# Patient Record
Sex: Male | Born: 1995 | Race: Black or African American | Hispanic: No | Marital: Single | Smoking: Never smoker
Health system: Southern US, Community
[De-identification: ages and names within clinical notes are randomized; demographics above are authoritative.]

## PROBLEM LIST (undated history)

## (undated) ENCOUNTER — Emergency Department (HOSPITAL_COMMUNITY): Admission: EM | Discharge status: Absent without leave | Payer: Self-pay

## (undated) DIAGNOSIS — IMO0002 Reserved for concepts with insufficient information to code with codable children: Secondary | ICD-10-CM

## (undated) DIAGNOSIS — Z91199 Patient's noncompliance with other medical treatment and regimen due to unspecified reason: Secondary | ICD-10-CM

## (undated) DIAGNOSIS — Z9119 Patient's noncompliance with other medical treatment and regimen: Secondary | ICD-10-CM

## (undated) DIAGNOSIS — J020 Streptococcal pharyngitis: Secondary | ICD-10-CM

## (undated) HISTORY — PX: OTHER SURGICAL HISTORY: SHX169

## (undated) HISTORY — DX: Reserved for concepts with insufficient information to code with codable children: IMO0002

---
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
## 2015-04-15 DIAGNOSIS — J069 Acute upper respiratory infection, unspecified: Secondary | ICD-10-CM | POA: Diagnosis not present

## 2015-04-15 DIAGNOSIS — J09X2 Influenza due to identified novel influenza A virus with other respiratory manifestations: Secondary | ICD-10-CM | POA: Diagnosis not present

## 2015-04-15 DIAGNOSIS — J300 Vasomotor rhinitis: Secondary | ICD-10-CM | POA: Diagnosis not present

## 2015-05-04 ENCOUNTER — Emergency Department (HOSPITAL_COMMUNITY)
Admission: EM | Admit: 2015-05-04 | Discharge: 2015-05-04 | Disposition: A | Discharge status: Home / Self Care | Payer: Medicaid Other | Attending: Emergency Medicine | Admitting: Emergency Medicine

## 2015-05-04 DIAGNOSIS — K029 Dental caries, unspecified: Secondary | ICD-10-CM | POA: Insufficient documentation

## 2015-05-04 DIAGNOSIS — Z9119 Patient's noncompliance with other medical treatment and regimen: Secondary | ICD-10-CM | POA: Insufficient documentation

## 2015-05-04 DIAGNOSIS — Z8781 Personal history of (healed) traumatic fracture: Secondary | ICD-10-CM | POA: Insufficient documentation

## 2015-05-04 DIAGNOSIS — K0889 Other specified disorders of teeth and supporting structures: Secondary | ICD-10-CM | POA: Insufficient documentation

## 2015-05-04 DIAGNOSIS — Z791 Long term (current) use of non-steroidal anti-inflammatories (NSAID): Secondary | ICD-10-CM | POA: Insufficient documentation

## 2015-05-04 DIAGNOSIS — Z8709 Personal history of other diseases of the respiratory system: Secondary | ICD-10-CM | POA: Insufficient documentation

## 2015-05-04 MED ORDER — IBUPROFEN 800 MG PO TABS: 800.0000 mg | ORAL_TABLET | Freq: Three times a day (TID) | ORAL | Status: AC

## 2015-05-04 MED ORDER — PENICILLIN V POTASSIUM 500 MG PO TABS: 500.0000 mg | ORAL_TABLET | Freq: Three times a day (TID) | ORAL | Status: AC

## 2015-05-04 NOTE — Discharge Instructions (Signed)

## 2015-05-04 NOTE — ED Notes (Addendum)
Pt reports R lower dental pain radiating to top of head. No pain at present.
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
## 2015-06-23 ENCOUNTER — Emergency Department (HOSPITAL_COMMUNITY)
Admission: EM | Admit: 2015-06-23 | Discharge: 2015-06-23 | Disposition: A | Discharge status: Home / Self Care | Payer: 59 | Attending: Emergency Medicine | Admitting: Emergency Medicine

## 2015-06-23 ENCOUNTER — Encounter (HOSPITAL_COMMUNITY): Payer: Self-pay | Admitting: Emergency Medicine

## 2015-06-23 DIAGNOSIS — Z79899 Other long term (current) drug therapy: Secondary | ICD-10-CM | POA: Diagnosis not present

## 2015-06-23 DIAGNOSIS — R52 Pain, unspecified: Secondary | ICD-10-CM

## 2015-06-23 DIAGNOSIS — J029 Acute pharyngitis, unspecified: Secondary | ICD-10-CM | POA: Diagnosis not present

## 2015-06-23 DIAGNOSIS — Z791 Long term (current) use of non-steroidal anti-inflammatories (NSAID): Secondary | ICD-10-CM | POA: Insufficient documentation

## 2015-06-23 DIAGNOSIS — R509 Fever, unspecified: Secondary | ICD-10-CM | POA: Diagnosis present

## 2015-06-23 DIAGNOSIS — M791 Myalgia: Secondary | ICD-10-CM | POA: Diagnosis not present

## 2015-06-23 LAB — RAPID STREP SCREEN (MED CTR MEBANE ONLY): Streptococcus, Group A Screen (Direct): NEGATIVE

## 2015-06-23 MED ORDER — IBUPROFEN 800 MG PO TABS
800.0000 mg | ORAL_TABLET | Freq: Once | ORAL | Status: AC
  Administered 2015-06-23: 800 mg via ORAL
  Filled 2015-06-23: qty 1

## 2015-06-23 MED ORDER — DEXAMETHASONE 4 MG PO TABS
10.0000 mg | ORAL_TABLET | Freq: Once | ORAL | Status: AC
  Administered 2015-06-23: 10 mg via ORAL
  Filled 2015-06-23: qty 2

## 2015-06-23 MED ORDER — ACETAMINOPHEN 325 MG PO TABS
650.0000 mg | ORAL_TABLET | Freq: Once | ORAL | Status: AC
  Administered 2015-06-23: 650 mg via ORAL
  Filled 2015-06-23: qty 2

## 2015-06-23 NOTE — ED Notes (Signed)
C/o fever, chills, and sore throat with a headache x 3 days. Fever 101.8 in triage. Tonsils appear slightly enlarged but have no white spots on them. Pt states he coughed so much this morning it made him vomit.
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
## 2015-06-23 NOTE — Discharge Instructions (Signed)
Your rapid strep test was negative. We will send for culture. Take ibuprofen and tylenol at home for fever and body aches. Return to the ER for new or worsening symptoms.   Pharyngitis Pharyngitis is redness, pain, and swelling (inflammation) of your pharynx.  CAUSES  Pharyngitis is usually caused by infection. Most of the time, these infections are from viruses (viral) and are part of a cold. However, sometimes pharyngitis is caused by bacteria (bacterial). Pharyngitis can also be caused by allergies. Viral pharyngitis may be spread from person to person by coughing, sneezing, and personal items or utensils (cups, forks, spoons, toothbrushes). Bacterial pharyngitis may be spread from person to person by more intimate contact, such as kissing.  SIGNS AND SYMPTOMS  Symptoms of pharyngitis include:   Sore throat.   Tiredness (fatigue).   Low-grade fever.   Headache.  Joint pain and muscle aches.  Skin rashes.  Swollen lymph nodes.  Plaque-like film on throat or tonsils (often seen with bacterial pharyngitis). DIAGNOSIS  Your health care provider will ask you questions about your illness and your symptoms. Your medical history, along with a physical exam, is often all that is needed to diagnose pharyngitis. Sometimes, a rapid strep test is done. Other lab tests may also be done, depending on the suspected cause.  TREATMENT  Viral pharyngitis will usually get better in 3-4 days without the use of medicine. Bacterial pharyngitis is treated with medicines that kill germs (antibiotics).  HOME CARE INSTRUCTIONS   Drink enough water and fluids to keep your urine clear or pale yellow.   Only take over-the-counter or prescription medicines as directed by your health care provider:   If you are prescribed antibiotics, make sure you finish them even if you start to feel better.   Do not take aspirin.   Get lots of rest.   Gargle with 8 oz of salt water ( tsp of salt per 1 qt of  water) as often as every 1-2 hours to soothe your throat.   Throat lozenges (if you are not at risk for choking) or sprays may be used to soothe your throat. SEEK MEDICAL CARE IF:   You have large, tender lumps in your neck.  You have a rash.  You cough up green, yellow-brown, or bloody spit. SEEK IMMEDIATE MEDICAL CARE IF:   Your neck becomes stiff.  You drool or are unable to swallow liquids.  You vomit or are unable to keep medicines or liquids down.  You have severe pain that does not go away with the use of recommended medicines.  You have trouble breathing (not caused by a stuffy nose). MAKE SURE YOU:   Understand these instructions.  Will watch your condition.  Will get help right away if you are not doing well or get worse.   This information is not intended to replace advice given to you by your health care provider. Make sure you discuss any questions you have with your health care provider.   Document Released: 12/29/2004 Document Revised: 10/19/2012 Document Reviewed: 09/05/2012 Elsevier Interactive Patient Education [employer]2016 Elsevier Inc.

## 2015-06-25 LAB — CULTURE, GROUP A STREP (THRC)

## 2015-08-13 ENCOUNTER — Emergency Department (HOSPITAL_COMMUNITY)
Admission: EM | Admit: 2015-08-13 | Discharge: 2015-08-14 | Disposition: A | Discharge status: Home / Self Care | Payer: 59 | Attending: Emergency Medicine | Admitting: Emergency Medicine

## 2015-08-13 ENCOUNTER — Encounter (HOSPITAL_COMMUNITY): Payer: Self-pay | Admitting: Emergency Medicine

## 2015-08-13 DIAGNOSIS — Z202 Contact with and (suspected) exposure to infections with a predominantly sexual mode of transmission: Secondary | ICD-10-CM | POA: Insufficient documentation

## 2015-08-13 DIAGNOSIS — J029 Acute pharyngitis, unspecified: Secondary | ICD-10-CM | POA: Diagnosis present

## 2015-08-13 NOTE — ED Triage Notes (Signed)
Pt. reports throat " discomfort " onset 4 days ago , pt. stated oral sexual encounter this week requesting STD screening .

## 2015-08-13 NOTE — Discharge Instructions (Signed)
Please read and follow all provided instructions.  Your diagnoses today include:  1. Possible exposure to STD    Tests performed today include: Test for gonorrhea and chlamydia. You will be notified by telephone if you have a positive result. Vital signs. See below for your results today.   Home care instructions:  Read educational materials contained in this packet and follow any instructions provided.   You should tell your partners about your infection and avoid having sex for one week to allow time for the medicine to work.  Follow-up instructions: You should follow-up with the [facility] STD clinic to be tested for HIV, syphilis, and hepatitis -- all of which can be transmitted by sexual contact. We do not routinely screen for these in the Emergency Department.  STD Testing: [facility] Department of [facility], [facility] [address], [place], phone [phone] or [phone]   Monday - Friday, call for an appointment [facility] Department of [facility], [facility] [address], [place], phone [phone] or [phone]  Monday - Friday, call for an appointment  Return instructions:  Please return to the Emergency Department if you experience worsening symptoms.  Please return if you have any other emergent concerns.  Additional Information:  Your vital signs today were: BP 123/72 (BP Location: Right Arm)    Pulse 84    Temp 98.5 F (36.9 C) (Oral)    Resp 12    SpO2 100%  If your blood pressure (BP) was elevated above 135/85 this visit, please have this repeated by your doctor within one month. --------------
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
## 2015-08-13 NOTE — ED Notes (Signed)
Patient able to ambulate independently  

## 2015-08-14 LAB — GC/CHLAMYDIA PROBE AMP (~~LOC~~) NOT AT ARMC
Chlamydia: NEGATIVE
Neisseria Gonorrhea: NEGATIVE

## 2015-08-14 LAB — HIV ANTIBODY (ROUTINE TESTING W REFLEX): HIV Screen 4th Generation wRfx: NONREACTIVE

## 2015-08-16 ENCOUNTER — Telehealth (HOSPITAL_BASED_OUTPATIENT_CLINIC_OR_DEPARTMENT_OTHER): Payer: Self-pay

## 2015-08-27 ENCOUNTER — Encounter (HOSPITAL_COMMUNITY): Payer: Self-pay | Admitting: Emergency Medicine

## 2015-08-27 DIAGNOSIS — J029 Acute pharyngitis, unspecified: Secondary | ICD-10-CM | POA: Diagnosis not present

## 2015-08-27 DIAGNOSIS — Z5321 Procedure and treatment not carried out due to patient leaving prior to being seen by health care provider: Secondary | ICD-10-CM | POA: Insufficient documentation

## 2015-08-27 NOTE — ED Triage Notes (Signed)
Pt is c/o sore throat x 2 weeks and started running a fever 2 days ago  Pt also c/o headache and body aches

## 2015-08-27 NOTE — ED Triage Notes (Signed)
Pt would also like to be tested for STDs

## 2015-08-28 ENCOUNTER — Emergency Department (HOSPITAL_COMMUNITY)
Admission: EM | Admit: 2015-08-28 | Discharge: 2015-08-28 | Disposition: A | Discharge status: Home / Self Care | Payer: 59 | Attending: Dermatology | Admitting: Dermatology

## 2015-08-28 NOTE — ED Triage Notes (Signed)
Called to take to treatment room  No response from lobby 

## 2015-08-28 NOTE — ED Triage Notes (Signed)
Called for second time without response from lobby 

## 2015-08-28 NOTE — ED Triage Notes (Signed)
Called for third time  No response from lobby  

## 2016-01-01 ENCOUNTER — Emergency Department (HOSPITAL_COMMUNITY)
Admission: EM | Admit: 2016-01-01 | Discharge: 2016-01-01 | Disposition: A | Discharge status: Home / Self Care | Payer: 59 | Attending: Emergency Medicine | Admitting: Emergency Medicine

## 2016-01-01 ENCOUNTER — Encounter (HOSPITAL_COMMUNITY): Payer: Self-pay | Admitting: Vascular Surgery

## 2016-01-01 DIAGNOSIS — R369 Urethral discharge, unspecified: Secondary | ICD-10-CM | POA: Insufficient documentation

## 2016-01-01 DIAGNOSIS — Z202 Contact with and (suspected) exposure to infections with a predominantly sexual mode of transmission: Secondary | ICD-10-CM | POA: Diagnosis not present

## 2016-01-01 DIAGNOSIS — Z711 Person with feared health complaint in whom no diagnosis is made: Secondary | ICD-10-CM

## 2016-01-01 LAB — URINALYSIS, ROUTINE W REFLEX MICROSCOPIC
BACTERIA UA: NONE SEEN
Bilirubin Urine: NEGATIVE
Glucose, UA: NEGATIVE mg/dL
Hgb urine dipstick: NEGATIVE
Ketones, ur: NEGATIVE mg/dL
NITRITE: NEGATIVE
Protein, ur: NEGATIVE mg/dL
SPECIFIC GRAVITY, URINE: 1.01 (ref 1.005–1.030)
SQUAMOUS EPITHELIAL / LPF: NONE SEEN
pH: 7 (ref 5.0–8.0)

## 2016-01-01 MED ORDER — FLUCONAZOLE 100 MG PO TABS
200.0000 mg | ORAL_TABLET | Freq: Once | ORAL | Status: AC
  Administered 2016-01-01: 200 mg via ORAL
  Filled 2016-01-01: qty 2

## 2016-01-01 MED ORDER — AZITHROMYCIN 250 MG PO TABS
1000.0000 mg | ORAL_TABLET | Freq: Once | ORAL | Status: AC
  Administered 2016-01-01: 1000 mg via ORAL
  Filled 2016-01-01: qty 4

## 2016-01-01 MED ORDER — CEFTRIAXONE SODIUM 250 MG IJ SOLR
250.0000 mg | Freq: Once | INTRAMUSCULAR | Status: AC
  Administered 2016-01-01: 250 mg via INTRAMUSCULAR
  Filled 2016-01-01: qty 250

## 2016-01-01 NOTE — ED Notes (Signed)
See EDP assessment 

## 2016-01-01 NOTE — ED Notes (Signed)
ED Provider at bedside. 
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
## 2016-01-01 NOTE — ED Triage Notes (Signed)
Pt reports to the ED for eval of white penile d/c that started on Wednesday or Thursday. He also reports a discomfort with urinating. Pt denies any recent unprotected sexual intercourse. Pt denies any N/V/D or abd pain.

## 2016-01-02 LAB — GC/CHLAMYDIA PROBE AMP (~~LOC~~) NOT AT ARMC
CHLAMYDIA, DNA PROBE: NEGATIVE
NEISSERIA GONORRHEA: NEGATIVE

## 2016-01-05 LAB — URINE CULTURE

## 2016-01-06 ENCOUNTER — Telehealth (HOSPITAL_BASED_OUTPATIENT_CLINIC_OR_DEPARTMENT_OTHER): Payer: Self-pay | Admitting: Emergency Medicine

## 2016-01-06 NOTE — Progress Notes (Signed)
ED Antimicrobial Stewardship Positive Culture Follow Up   Mark Haynes is an 20 y.o. male who [facility] with a chief complaint of  Chief Complaint  Patient presents with  . Penile Discharge    Recent Results (from the past 720 hour(s))  Urine culture     Status: Abnormal   Collection Time: 01/01/16  8:10 PM  Result Value Ref Range Status   Specimen Description URINE, RANDOM  Final   Special Requests NONE  Final   Culture (A)  Final    >=100,000 COLONIES/mL VIRIDANS STREPTOCOCCUS Standardized susceptibility testing for this organism is not available.    Report Status 01/05/2016 FINAL  Final    [x]  No additional treatment indicated.   20 y/o M with penile discharge and irritation upon urination. Urine culture grew step viridans. Pt received Rocephin and Azithro in the ED. No additional treatment is needed.   ED Provider: Jaynie Crumbleatyana Kirichenko PA-C   Sandi CarneNick Kamie Haynes, PharmD, BCPS Pharmacy Resident Pager: [phone] 01/06/2016, 8:15 AM Infectious Diseases Pharmacist Phone# [phone]

## 2016-01-06 NOTE — Telephone Encounter (Signed)
Post ED Visit - Positive Culture Follow-up  Culture report reviewed by antimicrobial stewardship pharmacist:  []  Enzo BiNathan Batchelder, Pharm.D. []  Celedonio MiyamotoJeremy Frens, [address]Pharm.D., BCPS []  Garvin FilaMike Maccia, Pharm.D. []  Georgina PillionElizabeth Martin, Pharm.D., BCPS []  [place] Pham, [place] BCPS, AAHIVP []  Estella HuskMichelle Turner, Pharm.D., BCPS, AAHIVP []  Tennis Mustassie Stewart, Pharm.D. []  Sherle Poeob Vincent, [place]D.  Positive urine culture Treated with rocephin and azithromycin, organism sensitive to the same and no further patient follow-up is required at this time.  Berle MullMiller, Johnsie Moscoso 01/06/2016, 10:20 AM

## 2016-09-16 ENCOUNTER — Encounter (HOSPITAL_COMMUNITY): Payer: Self-pay | Admitting: *Deleted

## 2016-09-16 ENCOUNTER — Emergency Department (HOSPITAL_COMMUNITY)
Admission: EM | Admit: 2016-09-16 | Discharge: 2016-09-16 | Disposition: A | Discharge status: Home / Self Care | Payer: 59 | Attending: Emergency Medicine | Admitting: Emergency Medicine

## 2016-09-16 DIAGNOSIS — Z79899 Other long term (current) drug therapy: Secondary | ICD-10-CM | POA: Insufficient documentation

## 2016-09-16 DIAGNOSIS — Z202 Contact with and (suspected) exposure to infections with a predominantly sexual mode of transmission: Secondary | ICD-10-CM | POA: Diagnosis not present

## 2016-09-16 DIAGNOSIS — Z711 Person with feared health complaint in whom no diagnosis is made: Secondary | ICD-10-CM | POA: Diagnosis not present

## 2016-09-16 DIAGNOSIS — Z791 Long term (current) use of non-steroidal anti-inflammatories (NSAID): Secondary | ICD-10-CM | POA: Diagnosis not present

## 2016-09-16 LAB — URINALYSIS, ROUTINE W REFLEX MICROSCOPIC
BILIRUBIN URINE: NEGATIVE
Glucose, UA: NEGATIVE mg/dL
Hgb urine dipstick: NEGATIVE
KETONES UR: NEGATIVE mg/dL
Leukocytes, UA: NEGATIVE
NITRITE: NEGATIVE
PROTEIN: NEGATIVE mg/dL
Specific Gravity, Urine: 1.025 (ref 1.005–1.030)
pH: 6 (ref 5.0–8.0)

## 2016-09-16 MED ORDER — CEFTRIAXONE SODIUM 250 MG IJ SOLR
250.0000 mg | Freq: Once | INTRAMUSCULAR | Status: AC
  Administered 2016-09-16: 250 mg via INTRAMUSCULAR
  Filled 2016-09-16: qty 250

## 2016-09-16 MED ORDER — AZITHROMYCIN 250 MG PO TABS
1000.0000 mg | ORAL_TABLET | Freq: Once | ORAL | Status: AC
  Administered 2016-09-16: 1000 mg via ORAL
  Filled 2016-09-16: qty 4
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
## 2016-09-16 NOTE — ED Triage Notes (Signed)
Pt reports exposure to std two months ago, denies any symptoms.

## 2016-09-16 NOTE — Discharge Instructions (Signed)
Do not have sex for 2 weeks °Have all partners tested and treated °If your test is abnormal, you will be called but you have been treated for Gonorrhea and Chlamydia today. You can also review your results on MyChart °Practice safe sex and use a condom to prevent infection or unwanted pregnancy °Follow up with the Health Department ° °

## 2016-09-17 LAB — RPR: RPR: NONREACTIVE

## 2016-09-17 LAB — HIV ANTIBODY (ROUTINE TESTING W REFLEX): HIV Screen 4th Generation wRfx: NONREACTIVE

## 2016-09-21 LAB — GC/CHLAMYDIA PROBE AMP (~~LOC~~) NOT AT ARMC
Chlamydia: NEGATIVE
NEISSERIA GONORRHEA: NEGATIVE

## 2017-05-06 ENCOUNTER — Emergency Department (HOSPITAL_COMMUNITY): Payer: 59

## 2017-05-06 ENCOUNTER — Encounter (HOSPITAL_COMMUNITY): Payer: Self-pay | Admitting: Emergency Medicine

## 2017-05-06 ENCOUNTER — Emergency Department (HOSPITAL_COMMUNITY)
Admission: EM | Admit: 2017-05-06 | Discharge: 2017-05-06 | Disposition: A | Discharge status: Home / Self Care | Payer: 59 | Attending: Emergency Medicine | Admitting: Emergency Medicine

## 2017-05-06 ENCOUNTER — Other Ambulatory Visit: Payer: Self-pay

## 2017-05-06 DIAGNOSIS — Y999 Unspecified external cause status: Secondary | ICD-10-CM | POA: Diagnosis not present

## 2017-05-06 DIAGNOSIS — Y9389 Activity, other specified: Secondary | ICD-10-CM | POA: Diagnosis not present

## 2017-05-06 DIAGNOSIS — M791 Myalgia, unspecified site: Secondary | ICD-10-CM | POA: Diagnosis not present

## 2017-05-06 DIAGNOSIS — Y92410 Unspecified street and highway as the place of occurrence of the external cause: Secondary | ICD-10-CM | POA: Diagnosis not present

## 2017-05-06 DIAGNOSIS — S3992XA Unspecified injury of lower back, initial encounter: Secondary | ICD-10-CM | POA: Diagnosis not present

## 2017-05-06 DIAGNOSIS — S39012A Strain of muscle, fascia and tendon of lower back, initial encounter: Secondary | ICD-10-CM | POA: Insufficient documentation

## 2017-05-06 DIAGNOSIS — M5412 Radiculopathy, cervical region: Secondary | ICD-10-CM | POA: Diagnosis not present

## 2017-05-06 DIAGNOSIS — S161XXA Strain of muscle, fascia and tendon at neck level, initial encounter: Secondary | ICD-10-CM | POA: Diagnosis not present

## 2017-05-06 DIAGNOSIS — S199XXA Unspecified injury of neck, initial encounter: Secondary | ICD-10-CM | POA: Diagnosis not present

## 2017-05-06 MED ORDER — IBUPROFEN 800 MG PO TABS
800.0000 mg | ORAL_TABLET | Freq: Once | ORAL | Status: AC
  Administered 2017-05-06: 800 mg via ORAL
  Filled 2017-05-06: qty 1

## 2017-05-06 MED ORDER — CYCLOBENZAPRINE HCL 10 MG PO TABS: 10.0000 mg | ORAL_TABLET | Freq: Two times a day (BID) | ORAL | 0 refills | Status: DC | PRN

## 2017-05-06 MED ORDER — NAPROXEN 500 MG PO TABS: 500.0000 mg | ORAL_TABLET | Freq: Two times a day (BID) | ORAL | 0 refills | Status: DC

## 2017-05-06 MED ORDER — KETOROLAC TROMETHAMINE 30 MG/ML IJ SOLN
30.0000 mg | Freq: Once | INTRAMUSCULAR | Status: DC
  Filled 2017-05-06: qty 1

## 2017-05-06 NOTE — Discharge Instructions (Addendum)
Naproxen for pain inflammation.  Muscle spasms.  Avoid strenuous activity for several days.  Follow-up with student health or family doctor as needed.

## 2017-05-06 NOTE — ED Notes (Signed)
ED Provider at bedside. 

## 2017-05-06 NOTE — ED Notes (Signed)
Pt refused IM toradol, would like PO, will ask PA when available
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
## 2017-05-06 NOTE — ED Notes (Signed)
Tatyanna, PA notified pt refused IM medication
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
## 2017-12-18 ENCOUNTER — Encounter (HOSPITAL_COMMUNITY): Payer: Self-pay | Admitting: Emergency Medicine

## 2017-12-18 ENCOUNTER — Other Ambulatory Visit: Payer: Self-pay

## 2017-12-18 ENCOUNTER — Ambulatory Visit (HOSPITAL_COMMUNITY)
Admission: EM | Admit: 2017-12-18 | Discharge: 2017-12-18 | Disposition: A | Discharge status: Home / Self Care | Payer: 59 | Attending: Physician Assistant | Admitting: Physician Assistant

## 2017-12-18 DIAGNOSIS — Z7251 High risk heterosexual behavior: Secondary | ICD-10-CM | POA: Diagnosis not present

## 2017-12-18 DIAGNOSIS — Z202 Contact with and (suspected) exposure to infections with a predominantly sexual mode of transmission: Secondary | ICD-10-CM | POA: Diagnosis not present

## 2017-12-18 DIAGNOSIS — Z113 Encounter for screening for infections with a predominantly sexual mode of transmission: Secondary | ICD-10-CM | POA: Diagnosis not present

## 2017-12-18 DIAGNOSIS — Z711 Person with feared health complaint in whom no diagnosis is made: Secondary | ICD-10-CM

## 2017-12-18 MED ORDER — AZITHROMYCIN 250 MG PO TABS
ORAL_TABLET | ORAL | Status: AC
  Filled 2017-12-18: qty 4

## 2017-12-18 MED ORDER — CEFTRIAXONE SODIUM 250 MG IJ SOLR
INTRAMUSCULAR | Status: AC
  Filled 2017-12-18: qty 250

## 2017-12-18 MED ORDER — AZITHROMYCIN 250 MG PO TABS
1000.0000 mg | ORAL_TABLET | Freq: Once | ORAL | Status: AC
  Administered 2017-12-18: 1000 mg via ORAL

## 2017-12-18 MED ORDER — METRONIDAZOLE 500 MG PO TABS
ORAL_TABLET | ORAL | Status: AC
  Filled 2017-12-18: qty 4

## 2017-12-18 MED ORDER — CEFTRIAXONE SODIUM 250 MG IJ SOLR
250.0000 mg | Freq: Once | INTRAMUSCULAR | Status: AC
  Administered 2017-12-18: 250 mg via INTRAMUSCULAR

## 2017-12-18 MED ORDER — METRONIDAZOLE 500 MG PO TABS
2000.0000 mg | ORAL_TABLET | Freq: Once | ORAL | Status: AC
  Administered 2017-12-18: 2000 mg via ORAL

## 2017-12-18 NOTE — Discharge Instructions (Addendum)
You were treated empirically for gonorrhea, chlamydia, trichomonas.  Flagyl 2 g, azithromycin 1g by mouth and Rocephin 250mg  injection given in office today. Cytology sent, you will be contacted with any positive results that requires further treatment. Refrain from sexual activity and alcohol use for the next 7 days. Monitor for any worsening of symptoms, fever, abdominal pain, nausea, vomiting, to follow up for reevaluation.

## 2017-12-18 NOTE — ED Notes (Signed)
Obtained urine 

## 2017-12-18 NOTE — ED Triage Notes (Signed)
Patient denies symptoms.  Patient is in department requesting testing
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
